# Patient Record
Sex: Female | Born: 1995 | Race: White | Hispanic: No | Marital: Single | Smoking: Never smoker
Health system: Southern US, Community
[De-identification: ages and names within clinical notes are randomized; demographics above are authoritative.]

---
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
## 2016-05-19 IMAGING — CR DG FOOT COMPLETE 3+V*L*
3 series · 3 of 3 positions shown · non-contrast
Comparison: None.

CLINICAL DATA: Hit foot on cabinet 2 weeks ago with persistent pain
and bruising, initial encounter

EXAM:
LEFT FOOT - COMPLETE 3+ VIEW

[t foot ap left]
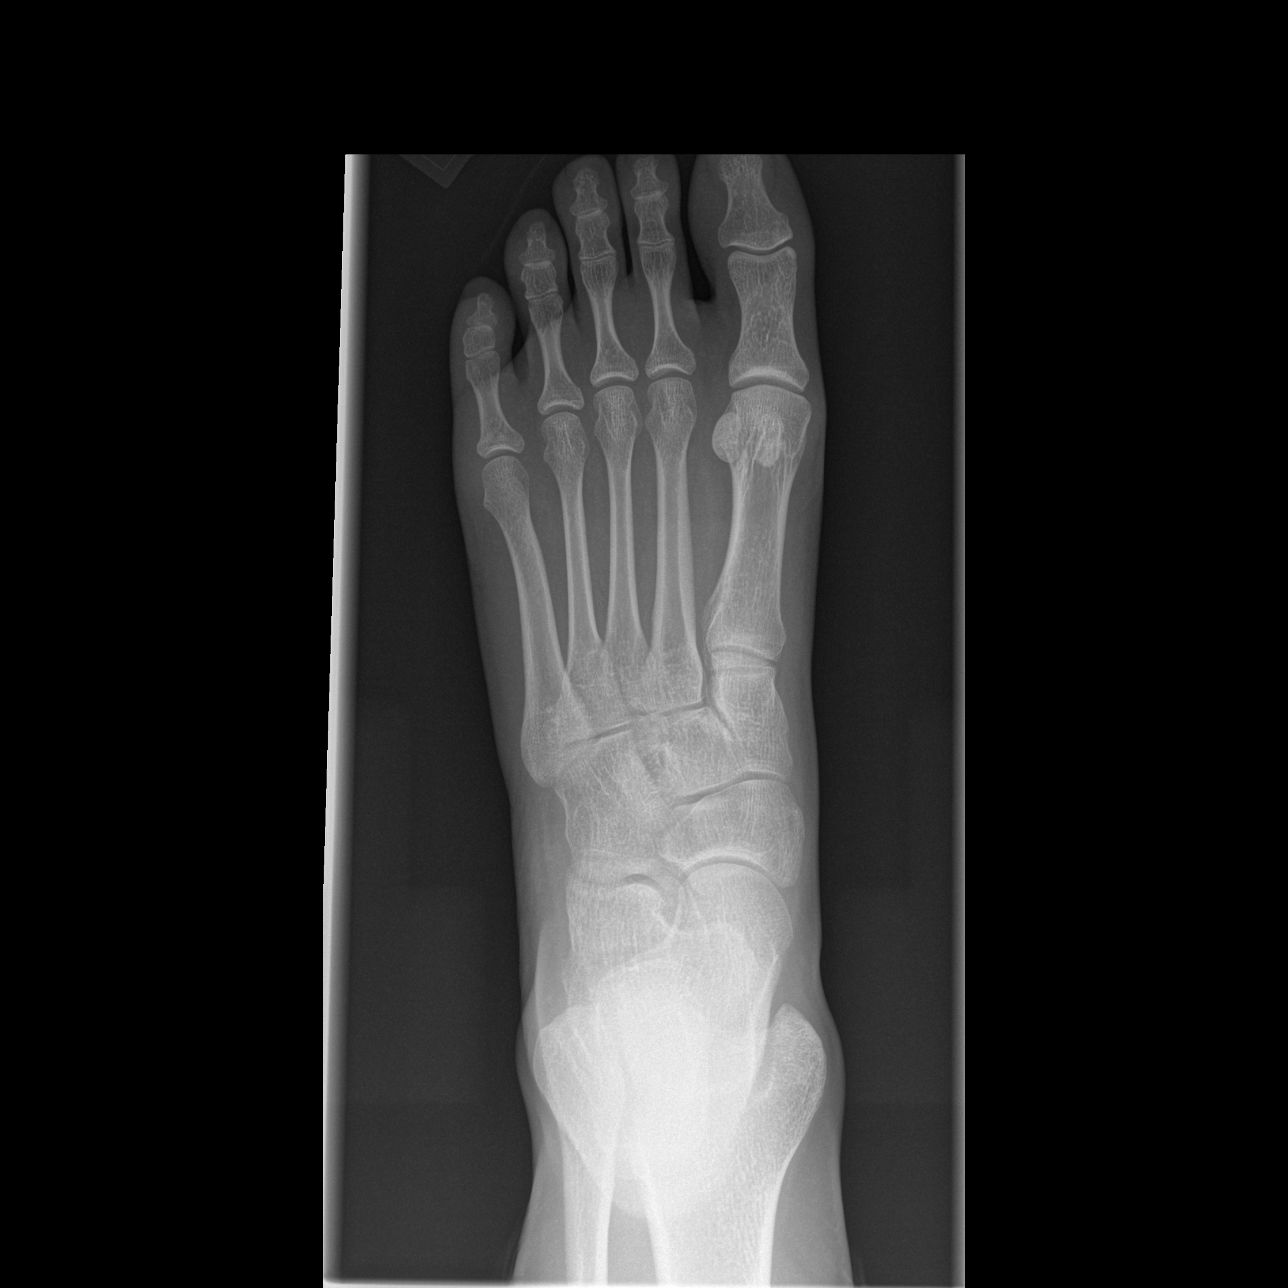

[t foot oblique left]
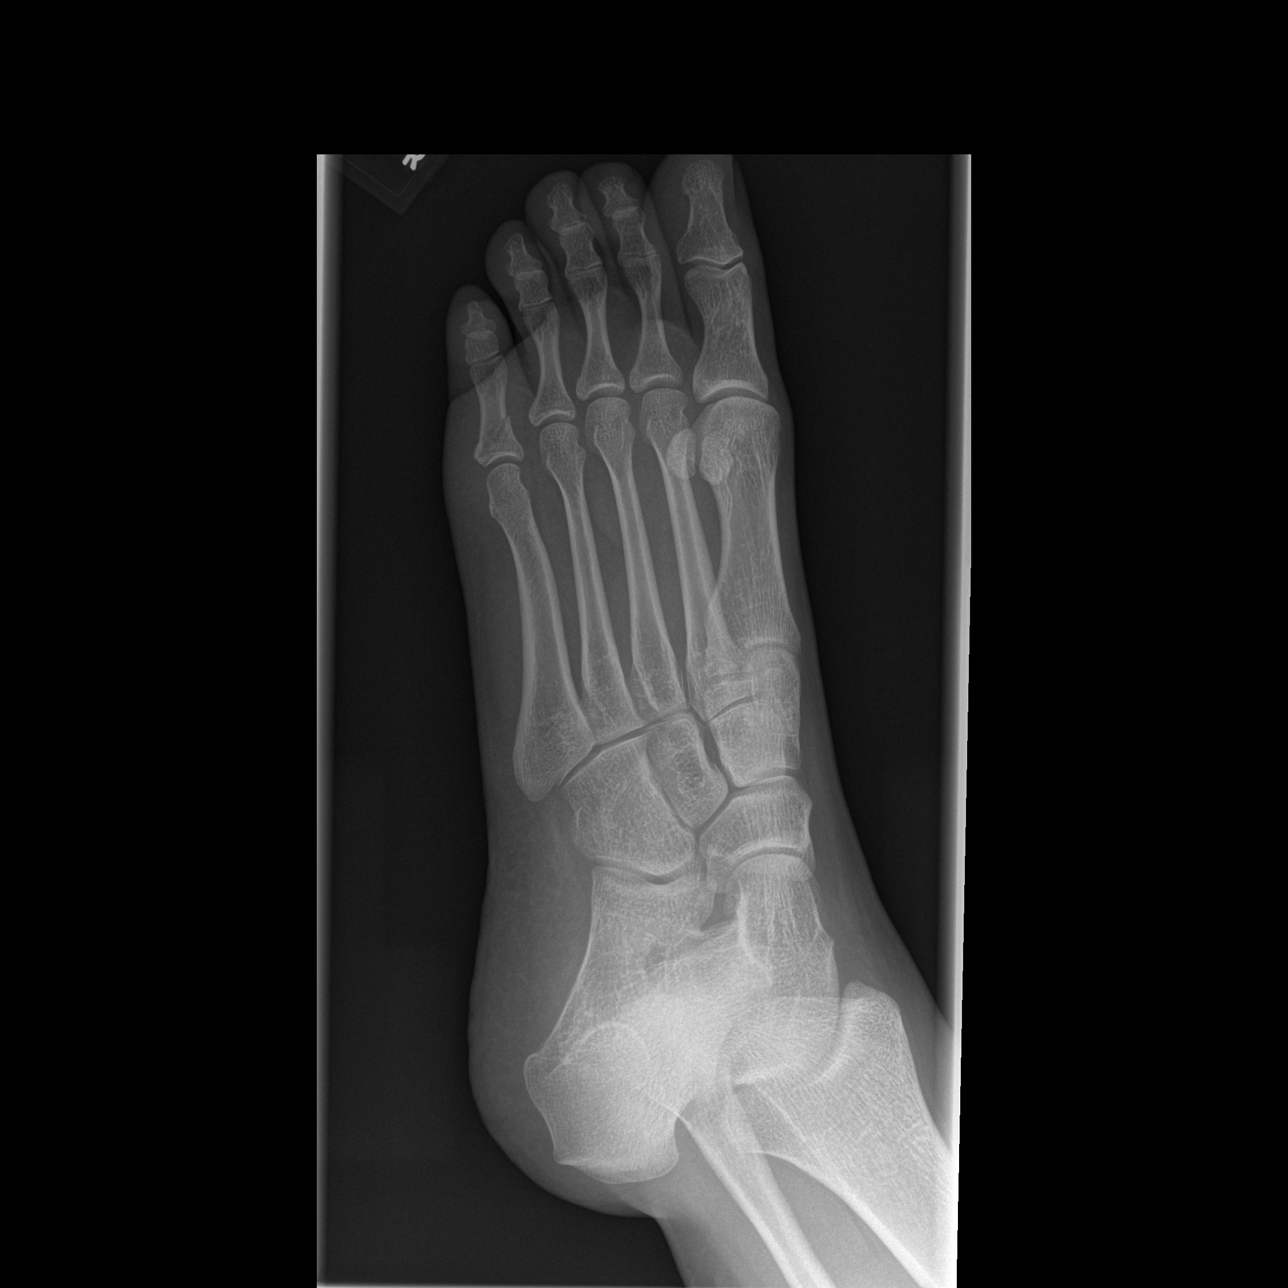

[t foot lat left]
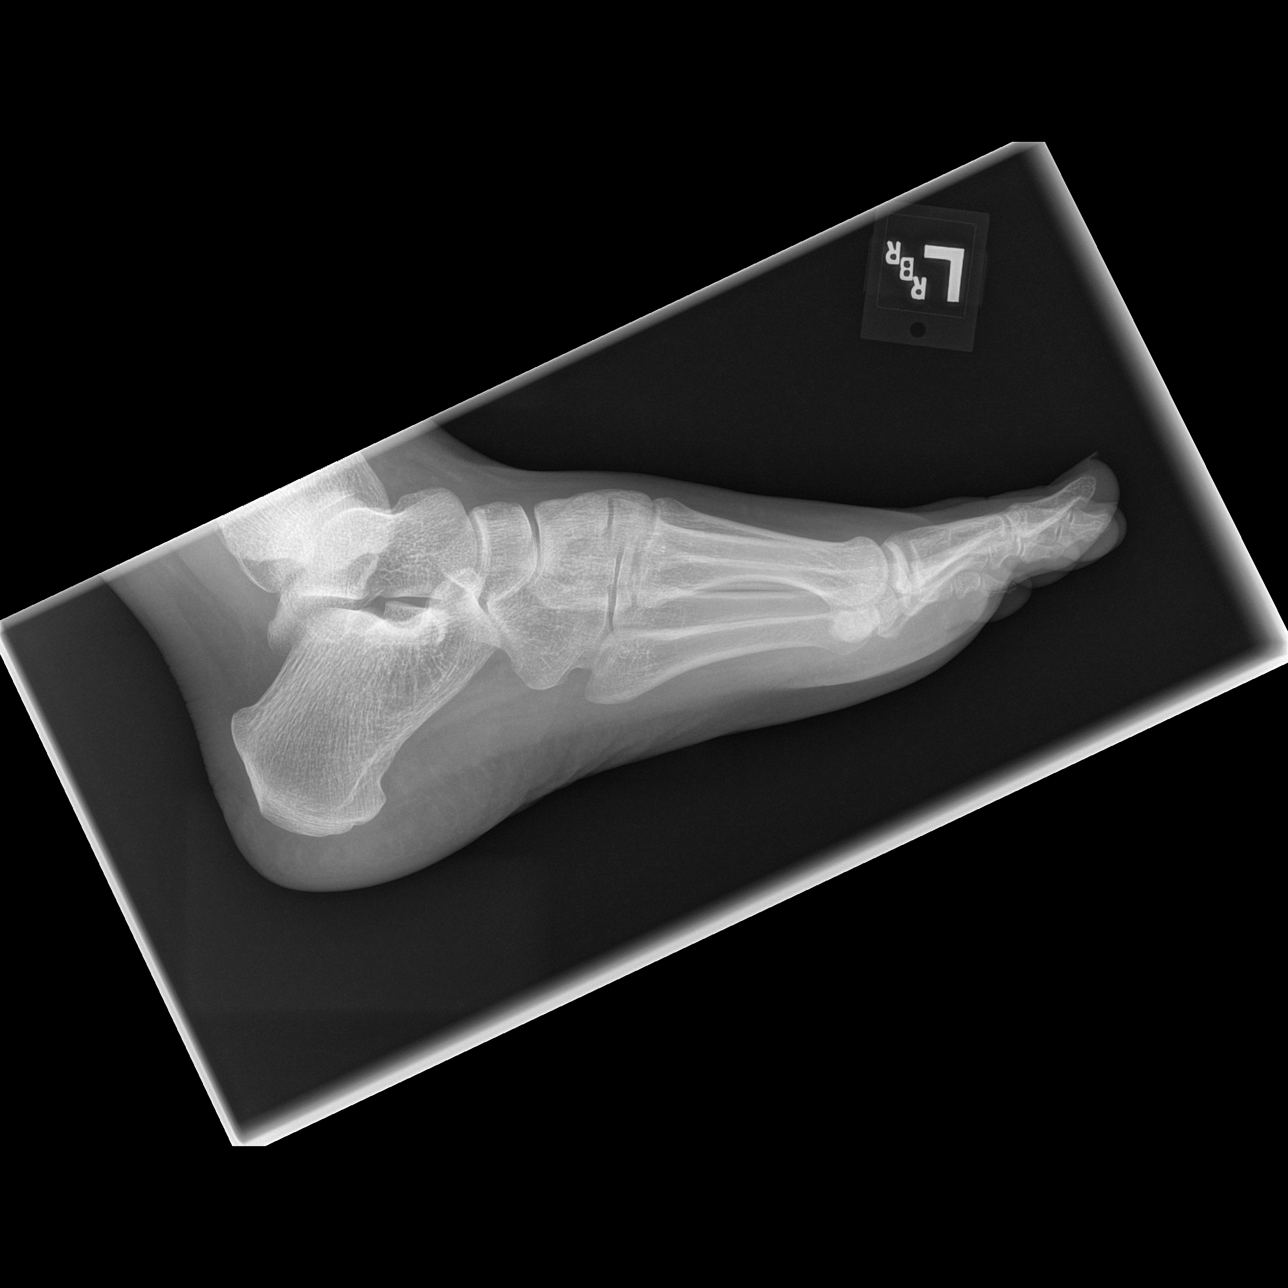

[3 of 3 positions shown; findings below may reference images not displayed]

FINDINGS: There is a minimally displaced fracture at the base of the fifth
proximal phalanx. No other fractures for soft tissue abnormalities
are noted.
IMPRESSION: Fifth proximal phalangeal fracture.
